# Patient Record
Sex: Male | Born: 1945 | Race: White | Hispanic: No | Marital: Married | Smoking: Current every day smoker
Health system: Southern US, Community
[De-identification: ages and names within clinical notes are randomized; demographics above are authoritative.]

## PROBLEM LIST (undated history)

## (undated) DIAGNOSIS — I219 Acute myocardial infarction, unspecified: Secondary | ICD-10-CM

## (undated) DIAGNOSIS — I509 Heart failure, unspecified: Secondary | ICD-10-CM

## (undated) DIAGNOSIS — I639 Cerebral infarction, unspecified: Secondary | ICD-10-CM

## (undated) HISTORY — PX: ABLATION: SHX5711

## (undated) HISTORY — PX: TOTAL SHOULDER ARTHROPLASTY: SHX126

## (undated) HISTORY — PX: GALLBLADDER SURGERY: SHX652

## (undated) HISTORY — PX: SKIN CANCER EXCISION: SHX779

---

## 2006-12-06 ENCOUNTER — Ambulatory Visit (HOSPITAL_COMMUNITY): Admission: RE | Admit: 2006-12-06 | Discharge: 2006-12-06 | Payer: Self-pay | Admitting: Urology

## 2013-09-22 ENCOUNTER — Emergency Department (HOSPITAL_COMMUNITY)
Admission: EM | Admit: 2013-09-22 | Discharge: 2013-09-22 | Disposition: A | Discharge status: Home / Self Care | Payer: Medicare Other | Attending: Emergency Medicine | Admitting: Emergency Medicine

## 2013-09-22 ENCOUNTER — Encounter (HOSPITAL_COMMUNITY): Payer: Self-pay | Admitting: Emergency Medicine

## 2013-09-22 ENCOUNTER — Emergency Department (HOSPITAL_COMMUNITY): Payer: Medicare Other

## 2013-09-22 DIAGNOSIS — R072 Precordial pain: Secondary | ICD-10-CM | POA: Insufficient documentation

## 2013-09-22 DIAGNOSIS — R42 Dizziness and giddiness: Secondary | ICD-10-CM | POA: Insufficient documentation

## 2013-09-22 DIAGNOSIS — Z8673 Personal history of transient ischemic attack (TIA), and cerebral infarction without residual deficits: Secondary | ICD-10-CM | POA: Insufficient documentation

## 2013-09-22 DIAGNOSIS — I509 Heart failure, unspecified: Secondary | ICD-10-CM | POA: Insufficient documentation

## 2013-09-22 DIAGNOSIS — R5381 Other malaise: Secondary | ICD-10-CM | POA: Insufficient documentation

## 2013-09-22 DIAGNOSIS — R079 Chest pain, unspecified: Secondary | ICD-10-CM

## 2013-09-22 DIAGNOSIS — I252 Old myocardial infarction: Secondary | ICD-10-CM | POA: Insufficient documentation

## 2013-09-22 DIAGNOSIS — R51 Headache: Secondary | ICD-10-CM | POA: Insufficient documentation

## 2013-09-22 DIAGNOSIS — R110 Nausea: Secondary | ICD-10-CM | POA: Insufficient documentation

## 2013-09-22 HISTORY — DX: Cerebral infarction, unspecified: I63.9

## 2013-09-22 HISTORY — DX: Heart failure, unspecified: I50.9

## 2013-09-22 HISTORY — DX: Acute myocardial infarction, unspecified: I21.9

## 2013-09-22 LAB — PRO B NATRIURETIC PEPTIDE: Pro B Natriuretic peptide (BNP): 124.9 pg/mL (ref 0–125)

## 2013-09-22 LAB — CBC WITH DIFFERENTIAL/PLATELET
Basophils Absolute: 0 10*3/uL (ref 0.0–0.1)
Basophils Relative: 1 % (ref 0–1)
Eosinophils Relative: 3 % (ref 0–5)
Hemoglobin: 15.5 g/dL (ref 13.0–17.0)
Lymphocytes Relative: 24 % (ref 12–46)
MCHC: 35.4 g/dL (ref 30.0–36.0)
MCV: 93.4 fL (ref 78.0–100.0)
Monocytes Absolute: 0.8 10*3/uL (ref 0.1–1.0)
Monocytes Relative: 10 % (ref 3–12)
Neutro Abs: 5.5 10*3/uL (ref 1.7–7.7)
Platelets: 294 10*3/uL (ref 150–400)
RBC: 4.69 MIL/uL (ref 4.22–5.81)

## 2013-09-22 LAB — TROPONIN I: Troponin I: <0.3 ng/mL (ref <0.30)

## 2013-09-22 LAB — COMPREHENSIVE METABOLIC PANEL
CO2: 27 mEq/L (ref 19–32)
Calcium: 8.9 mg/dL (ref 8.4–10.5)
Chloride: 101 mEq/L (ref 96–112)
GFR calc Af Amer: >90 mL/min (ref >90)
GFR calc non Af Amer: >90 mL/min (ref >90)
Glucose, Bld: 109 mg/dL — ABNORMAL HIGH (ref 70–99)
Total Bilirubin: 0.3 mg/dL (ref 0.3–1.2)

## 2013-09-22 MED ORDER — ONDANSETRON HCL 4 MG/2ML IJ SOLN
4.0000 mg | Freq: Once | INTRAMUSCULAR | Status: AC
  Administered 2013-09-22: 4 mg via INTRAVENOUS
  Filled 2013-09-22: qty 2

## 2013-09-22 MED ORDER — METHYLPREDNISOLONE SODIUM SUCC 125 MG IJ SOLR
125.0000 mg | INTRAMUSCULAR | Status: AC
  Administered 2013-09-22: 125 mg via INTRAVENOUS
  Filled 2013-09-22: qty 2

## 2013-09-22 MED ORDER — ONDANSETRON 4 MG PO TBDP
8.0000 mg | ORAL_TABLET | Freq: Once | ORAL | Status: AC
  Administered 2013-09-22: 8 mg via ORAL
  Filled 2013-09-22: qty 2

## 2013-09-22 MED ORDER — ONDANSETRON HCL 4 MG/2ML IJ SOLN
8.0000 mg | Freq: Once | INTRAMUSCULAR | Status: AC
  Administered 2013-09-22: 8 mg via INTRAVENOUS
  Filled 2013-09-22: qty 4

## 2013-09-22 MED ORDER — PREDNISONE 10 MG PO TABS: 40.0000 mg | ORAL_TABLET | Freq: Every day | ORAL | Status: AC

## 2013-09-22 MED ORDER — HYDROMORPHONE HCL PF 1 MG/ML IJ SOLN
1.0000 mg | Freq: Once | INTRAMUSCULAR | Status: AC
  Administered 2013-09-22: 1 mg via INTRAVENOUS
  Filled 2013-09-22: qty 1

## 2013-09-22 MED ORDER — ALBUTEROL SULFATE HFA 108 (90 BASE) MCG/ACT IN AERS: 2.0000 | INHALATION_SPRAY | RESPIRATORY_TRACT | Status: AC

## 2013-09-22 NOTE — ED Notes (Signed)
Dr.Lockwood at bedside  

## 2013-09-22 NOTE — ED Provider Notes (Signed)
CSN: [id number]     Arrival date & time 09/22/13  1224 History   First MD Initiated Contact with Patient 09/22/13 1222     Chief Complaint  Patient presents with  . Headache  . Chest Pain   (Consider location/radiation/quality/duration/timing/severity/associated sxs/prior Treatment) HPI  Patient presents with concern of headache, chest pain. Symptoms began 3 days ago, initially with chest pain.  Over the interval his developed headache, and continues to have other associated complaints. The chest pain is anterior, sternal, severe, pressure like, nonradiating.  There is no exertional or pleuritic component. There continues to be baseline dyspnea. The headache is left frontal, severe, nonradiating.  Pain is worse with activity.  Patient describes a twitching sensation in his left eye, but no explicit visual changes. Testicle is of nausea, but denies vomiting or diarrhea.  He denies fever, cough. Patient notes a history of prior strokes, prior MI. He has baseline left-sided weakness.  Past Medical History  Diagnosis Date  . Stroke   . CHF (congestive heart failure)   . MI (myocardial infarction)    History reviewed. No pertinent past surgical history. No family history on file. History  Substance Use Topics  . Smoking status: Not on file  . Smokeless tobacco: Not on file  . Alcohol Use: Not on file    Review of Systems  Constitutional:       Per HPI, otherwise negative  HENT:       Per HPI, otherwise negative  Respiratory:       Per HPI, otherwise negative  Cardiovascular:       Per HPI, otherwise negative  Gastrointestinal: Positive for nausea. Negative for vomiting and diarrhea.  Endocrine:       Negative aside from HPI  Genitourinary:       Neg aside from HPI   Musculoskeletal:       Per HPI, otherwise negative  Skin: Negative.   Neurological: Positive for dizziness, weakness, light-headedness and headaches. Negative for syncope, speech difficulty and numbness.     Allergies  Asa; Codeine; Oxycodone; and Sulfur  Home Medications  No current outpatient prescriptions on file. There were no vitals taken for this visit. Physical Exam  Nursing note and vitals reviewed. Constitutional: He is oriented to person, place, and time. He appears well-developed. No distress.  HENT:  Head: Normocephalic and atraumatic.  Eyes: Conjunctivae and EOM are normal. Pupils are equal, round, and reactive to light.  Neck: Full passive range of motion without pain. No muscular tenderness present. No rigidity. No edema present.  Cardiovascular: Normal rate and regular rhythm.   Pulmonary/Chest: Effort normal. No stridor. No respiratory distress.  Abdominal: He exhibits no distension.  Musculoskeletal: He exhibits no edema.  Neurological: He is alert and oriented to person, place, and time.  Weakness slightly greater on the left upper extremity than right.  Patient states that he typically has this deficiency. LE strength 5/5 sym, No facial asym AOx 3  Skin: Skin is warm and dry.  Psychiatric: He has a normal mood and affect.    ED Course  Procedures (including critical care time) Labs Review Labs Reviewed  CBC WITH DIFFERENTIAL  COMPREHENSIVE METABOLIC PANEL  TROPONIN I   Imaging Review No results found. EKG has sinus rhythm, rate 60, left bundle-branch block, T wave inversions laterally abnormal On cardiac monitor the patient is sinus rhythm, rate 65, unremarkable Pulse oximetry 97% on room and this is adequate  6:34 PM Patient voices some frustration about lack of nursing  tension.  He has no additional chest pain.  He does continue to have mild headache. Again we reviewed all his labs, imaging studies. I reviewed follow precautions, return instructions with him. On repeat examination, the patient's heart rate is 85, sinus, unremarkable   MDM  No diagnosis found. This patient presents with days of chest pressure, cough, dyspnea, headache.  On exam  he is awake alert, with no neurologic deficits.  Patient takes Plavix regularly.  Patient's EKG is unchanged from those are compared with his other hospital. Patient's troponin is unremarkable.  Though the patient does have history of heart disease, stroke, he is appropriately anticoagulated, and today's evaluation did not demonstrate acute findings.  Patient was substantially improved with analgesia here.  I had a lengthy discussion with the patient and his family members about the possible causes of his chest discomfort, dyspnea, headache.  With his history of COPD, he was treated empirically for the syncope.  There is no evidence of infection.  Absent neurologic findings, with essentially unremarkable head CT, there is low suspicion for occult CVA. Patient has a cardiologist with whom he will follow up in the morning.    Gerhard Munch, MD 09/22/13 [phone]

## 2013-09-22 NOTE — ED Notes (Signed)
Nathanial Rancher (daughter)  [phone] power of attorney/ Willoughby Doell [phone] (son)

## 2013-09-22 NOTE — ED Notes (Signed)
Pt c/o left sided HA and central CP that started 3-4 days ago. Pt has hx of stroke and MI. Vitals 183/84, 60 pulse pt rates pain in chest 3/10 and pain in head 9/10. On 12 lead pt showed inverted T waves. Pt was not given any meds en route. Pt allergic to ASA.

## 2013-09-22 NOTE — ED Notes (Signed)
Patient actively vomiting after discharge completed. MD informed. Zofran given. Family and patient did not wish to wait any longer prior to leaving. Patient left department with family via wheelchair.

## 2013-11-12 DIAGNOSIS — M546 Pain in thoracic spine: Secondary | ICD-10-CM | POA: Insufficient documentation

## 2014-08-31 DIAGNOSIS — I10 Essential (primary) hypertension: Secondary | ICD-10-CM | POA: Insufficient documentation

## 2014-08-31 DIAGNOSIS — I2510 Atherosclerotic heart disease of native coronary artery without angina pectoris: Secondary | ICD-10-CM | POA: Insufficient documentation

## 2014-08-31 DIAGNOSIS — E785 Hyperlipidemia, unspecified: Secondary | ICD-10-CM | POA: Insufficient documentation

## 2014-10-04 DIAGNOSIS — I447 Left bundle-branch block, unspecified: Secondary | ICD-10-CM | POA: Insufficient documentation

## 2015-04-14 DIAGNOSIS — I428 Other cardiomyopathies: Secondary | ICD-10-CM | POA: Insufficient documentation

## 2015-04-29 IMAGING — CT CT HEAD W/O CM
2 series · 15 of 30 positions shown, 19 images · non-contrast
Comparison: None.

CLINICAL DATA: Three-day history of headache with nausea

EXAM:
CT HEAD WITHOUT CONTRAST
TECHNIQUE: Contiguous axial images were obtained from the base of the skull
through the vertex without intravenous contrast. Study was obtained
within 24 hr of patient's arrival at the emergency department.

[Series 2: head w/o · axial · non-contrast · 0.49mm/px · z∈[+120,+245]mm · 13 of 31 slices shown, 17 images]
[im 3/31  brain]
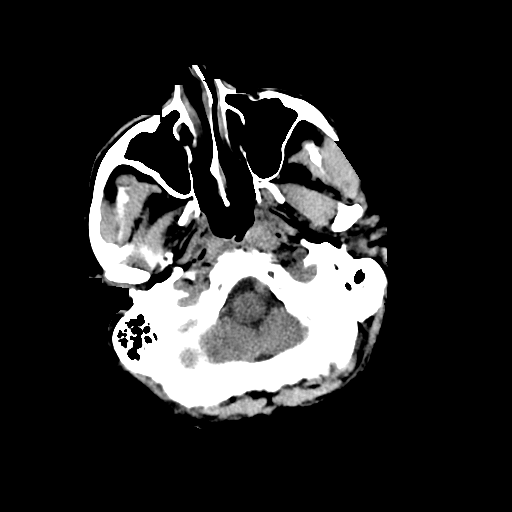
[im 3/31  bone]
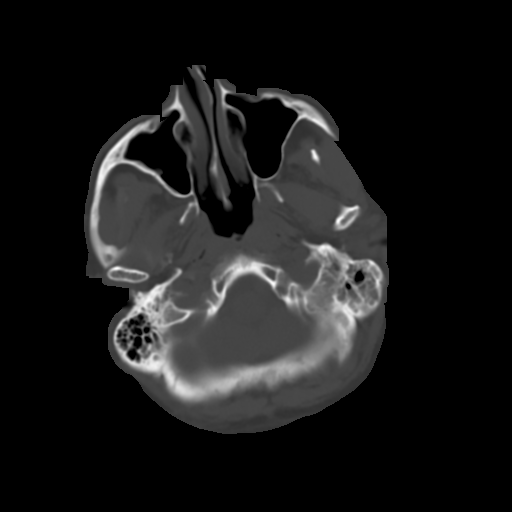
[im 5/31  brain]
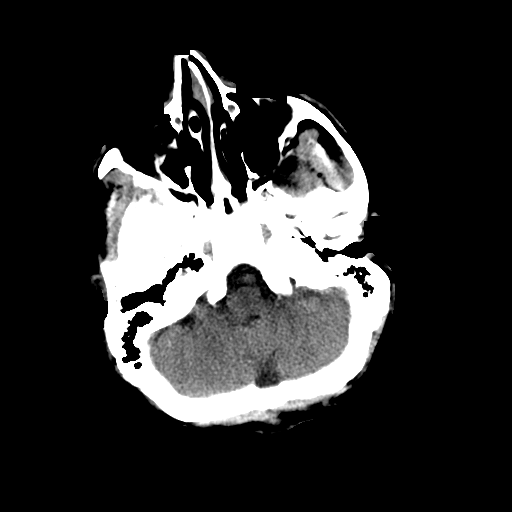
[im 7/31  brain]
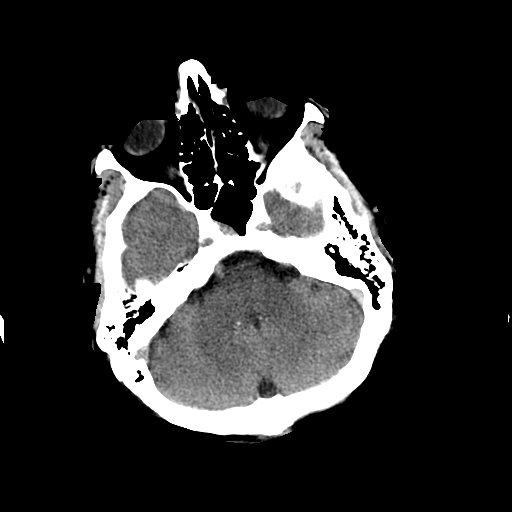
[im 9/31  brain]
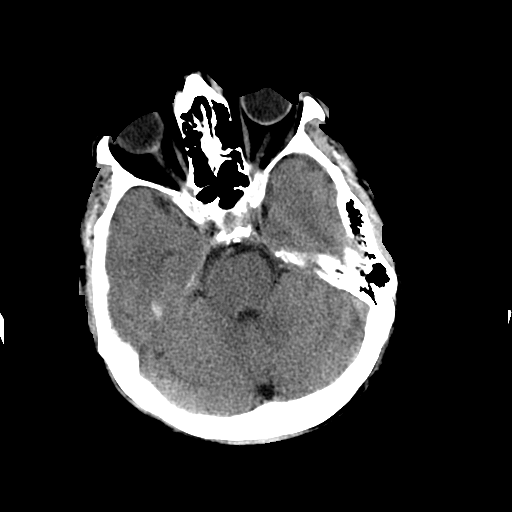
[im 11/31  brain]
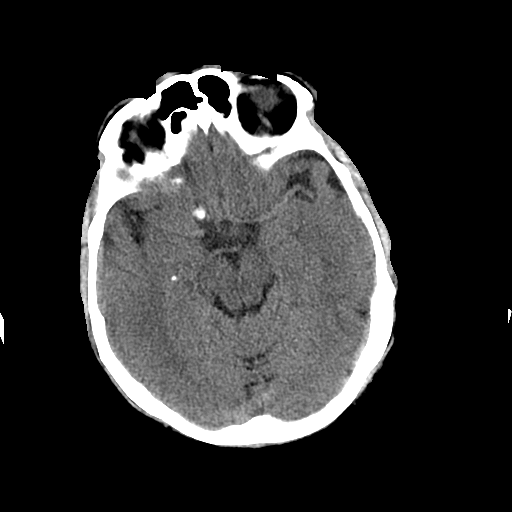
[im 11/31  bone]
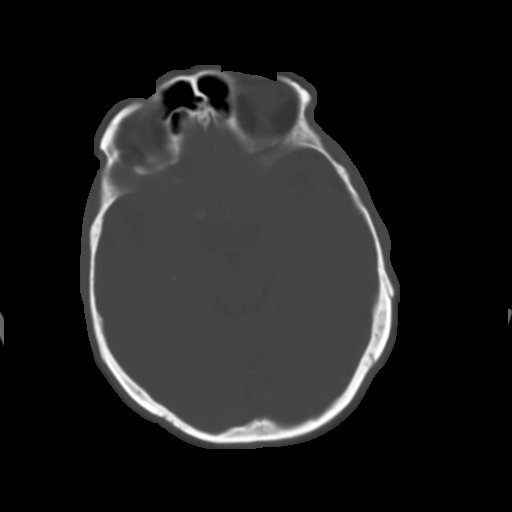
[im 13/31  brain]
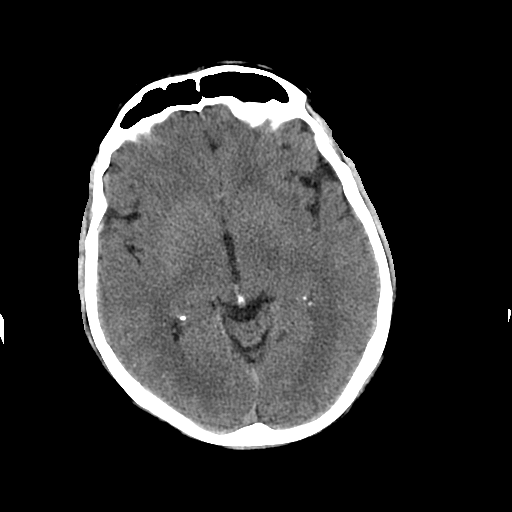
[im 16/31  brain]
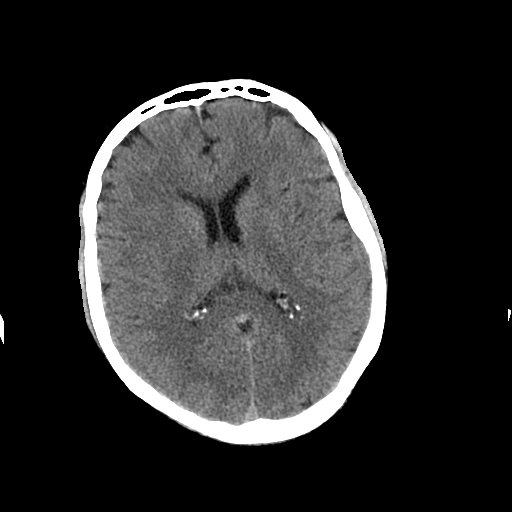
[im 18/31  brain]
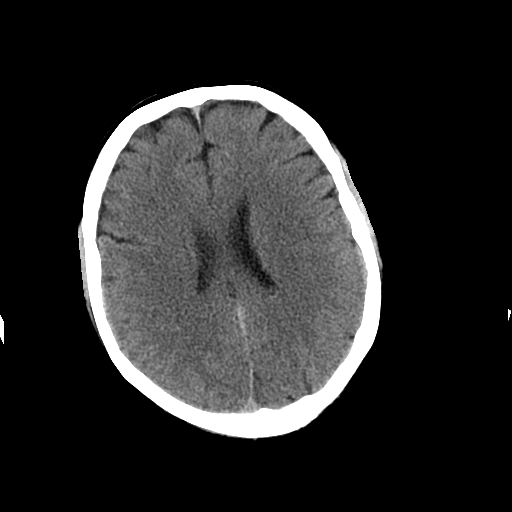
[im 20/31  brain]
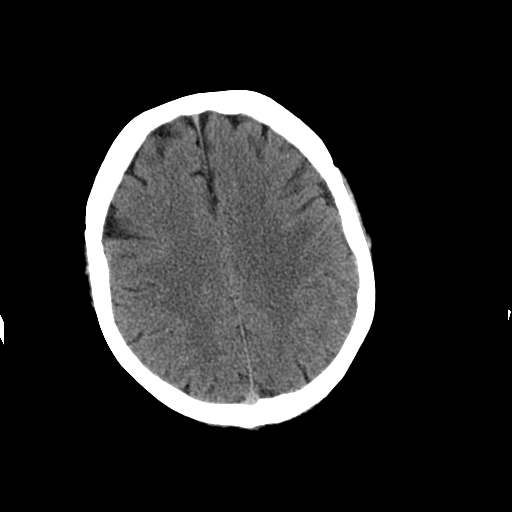
[im 20/31  bone]
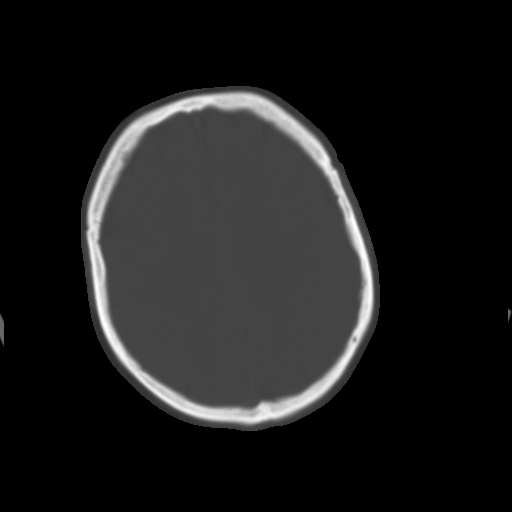
[im 22/31  brain]
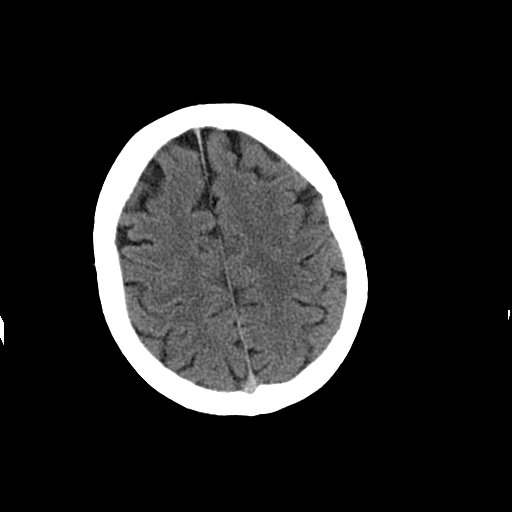
[im 24/31  brain]
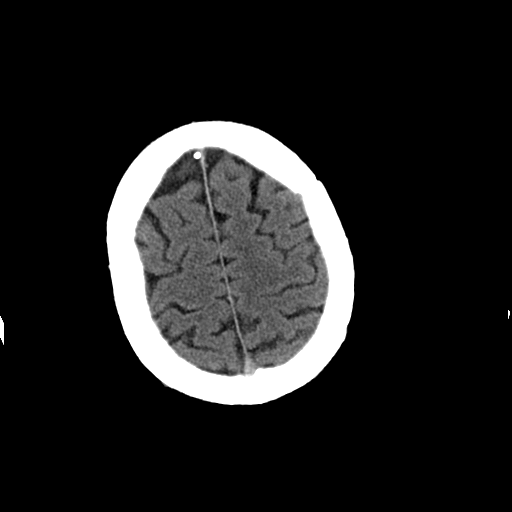
[im 26/31  brain]
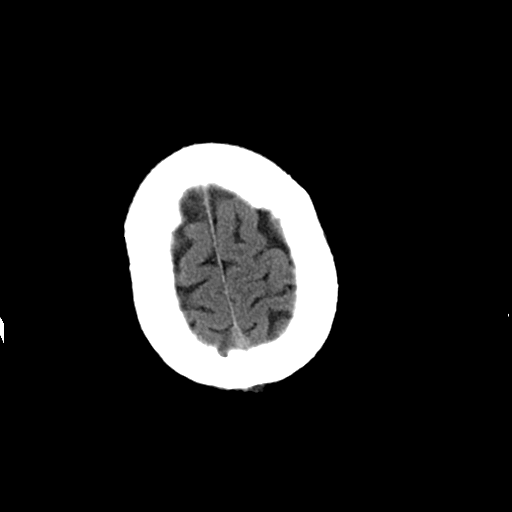
[im 28/31  brain]
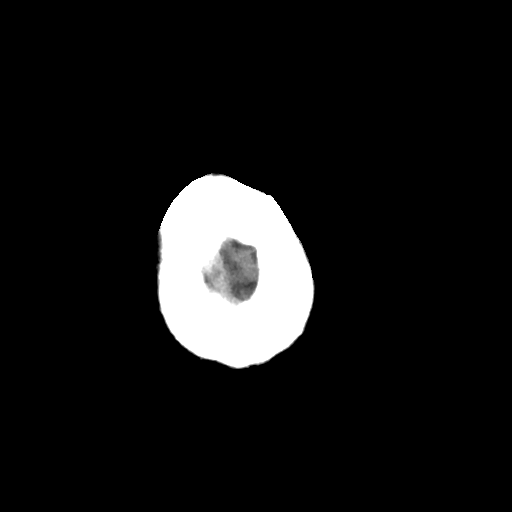
[im 28/31  bone]
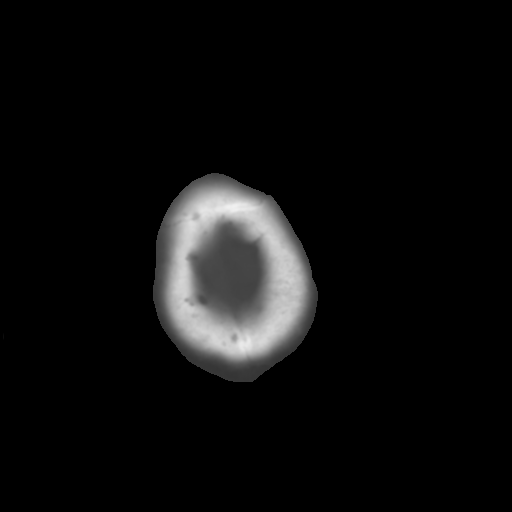

[Series 3: head w/o bone · axial · non-contrast · 0.49mm/px · z∈[+120,+140]mm · 2 of 31 slices shown]
[im 3/31  bone]
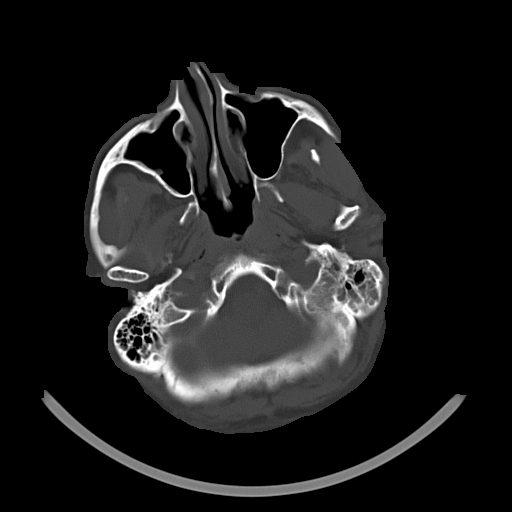
[im 7/31  bone]
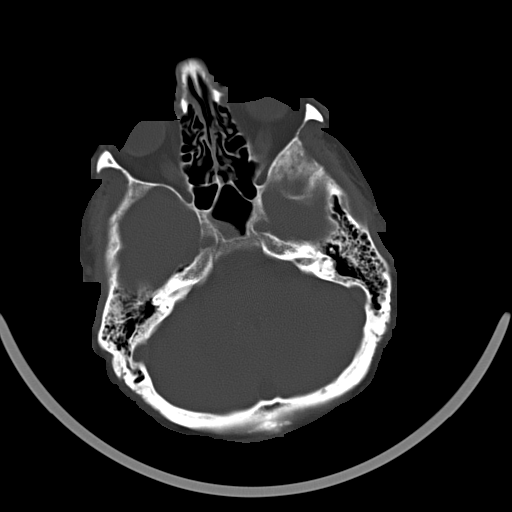

[15 of 30 positions shown; findings below may reference images not displayed]

FINDINGS: The ventricles are normal in size and configuration. There is no
mass, hemorrhage, extra-axial fluid collection, or midline shift.
There is mild small vessel disease in the centra semiovale
bilaterally. There is evidence of a small prior infarct at the
gray-white junction of the posterior left frontal lobe. No acute
appearing infarct is seen on this study.

Bony calvarium appears intact. There is mastoid air cell disease
bilaterally, considerably more on the left than on the right. No
air-fluid levels are identified in the mastoids. There is
right-sided sphenoid sinus disease as well as a retention cyst in
the posterior right maxillary antrum. There is leftward deviation of
the nasal septum.
IMPRESSION: The mild periventricular small vessel disease. Evidence of a prior
small infarct in the posterior left frontal lobe at the gray-white
junction. No acute appearing infarct. No mass or hemorrhage.

There is bilateral mastoid disease, more on the left than on the
right. There is mild paranasal sinus disease. There is leftward
deviation of the nasal septum.

## 2015-04-29 IMAGING — CR DG CHEST 2V
2 series · 2 of 2 positions shown · non-contrast
Comparison: 12/06/2006

CLINICAL DATA: Syncope. Weakness. Hypertension.

EXAM:
CHEST  2 VIEW

[w chest lat]
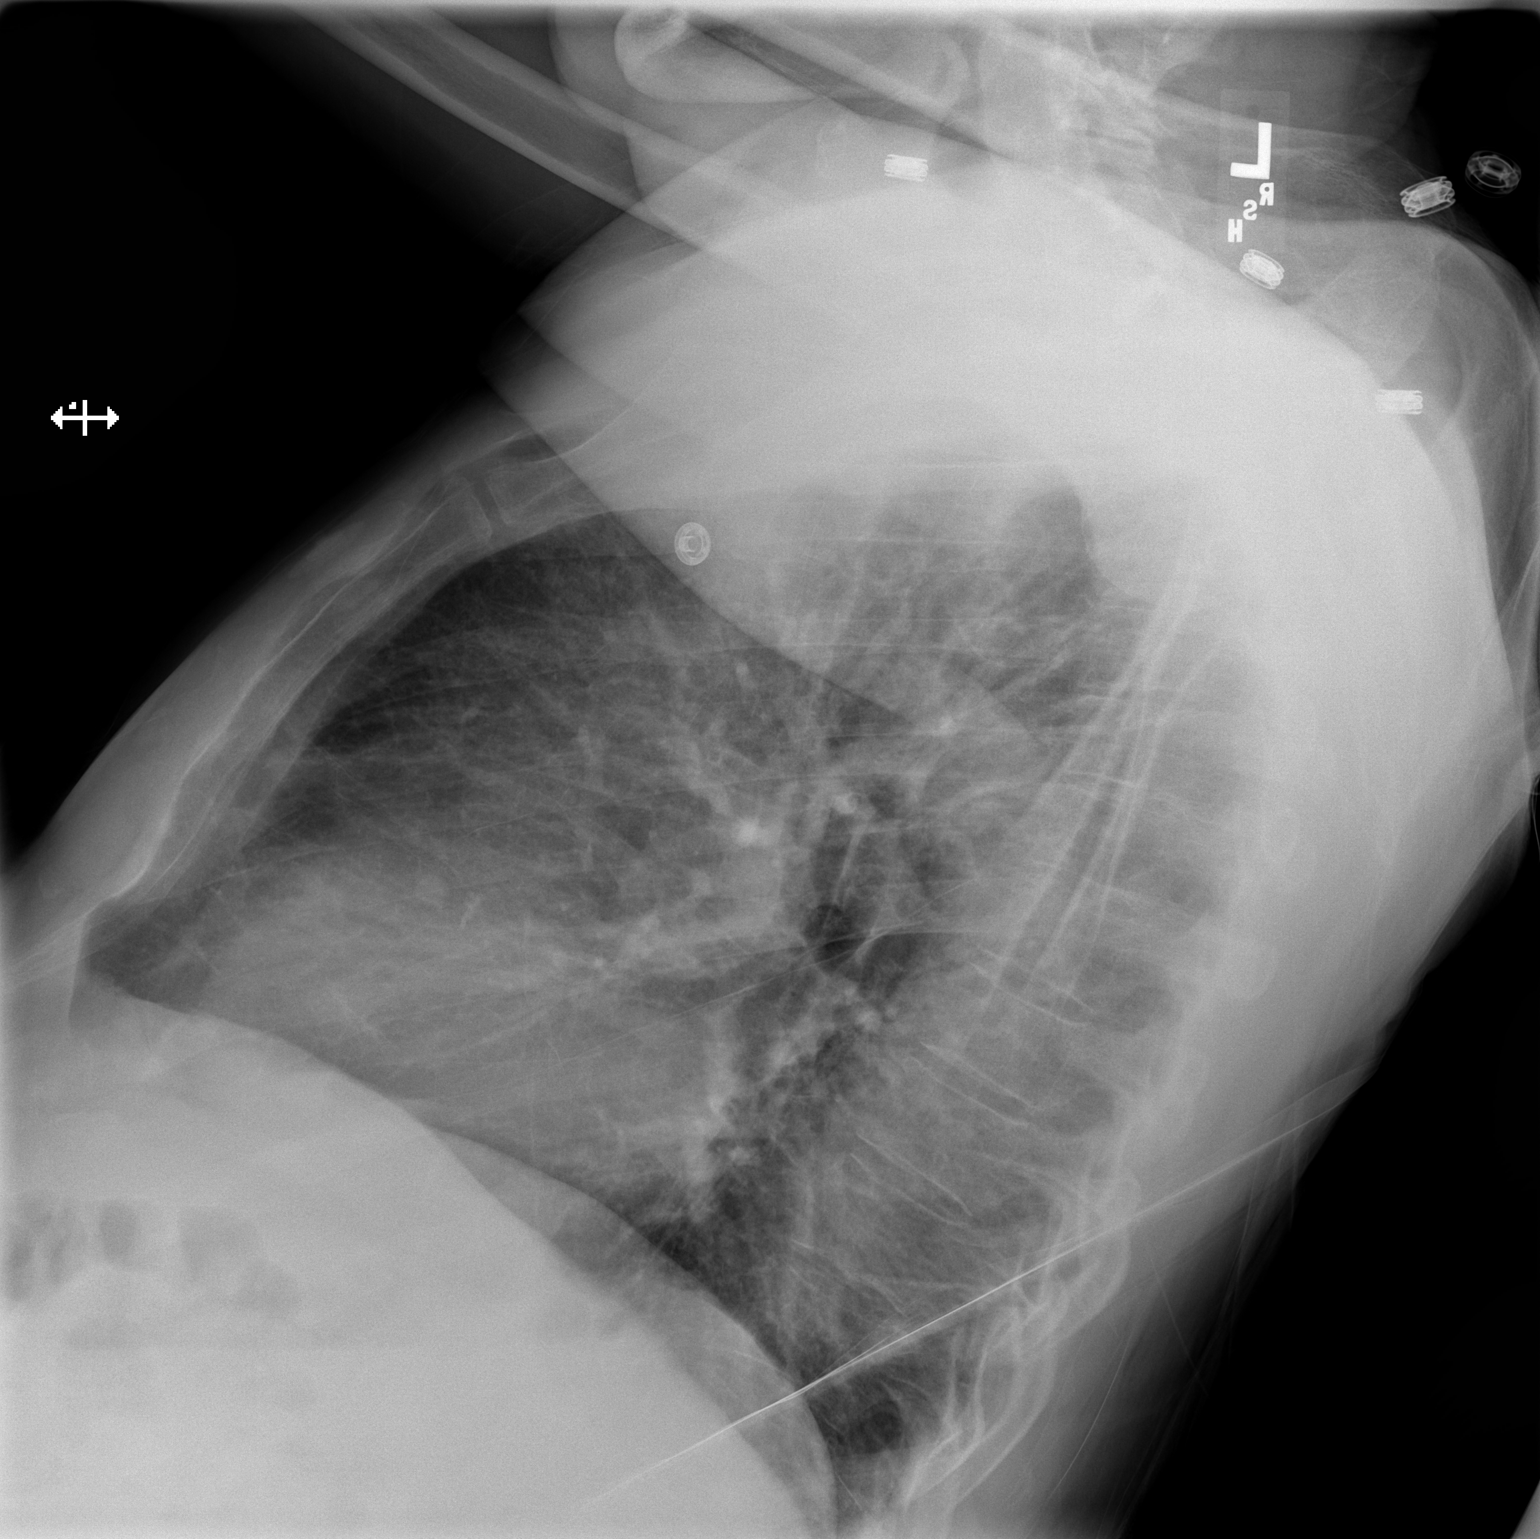

[x chest ap]
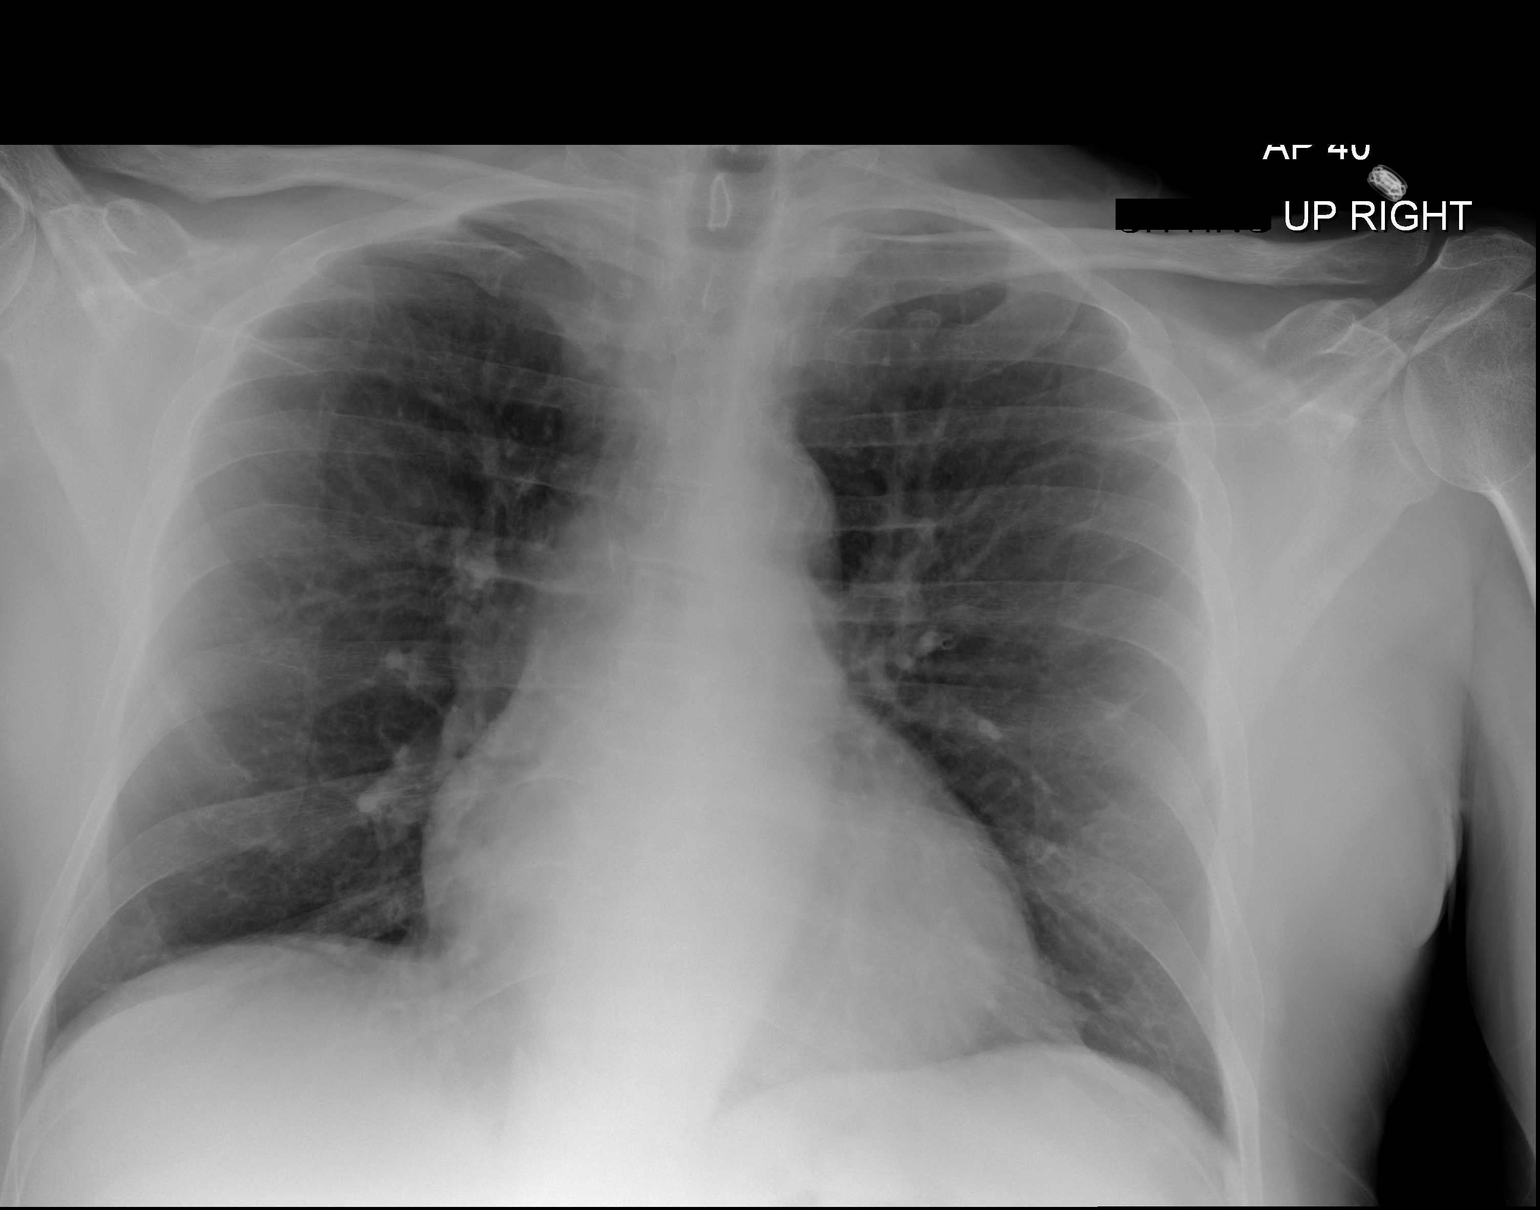

[2 of 2 positions shown; findings below may reference images not displayed]

FINDINGS: Tortuous thoracic aorta noted with atherosclerotic calcification of
the aortic arch. Heart size within normal limits for projection.
Subtle prominence of upper zone pulmonary vasculature.

Mild airway thickening. 9 mm nodule projects over the cardiac shadow
on the lateral projection and is not well correlated on the frontal
projection.
IMPRESSION: 1. Possible pulmonary nodule projecting over the cardiac shadow on
the lateral view. CT chest is recommended for further
characterization, to exclude malignancy.
2. Prominence of upper zone pulmonary vasculature may reflect
pulmonary venous hypertension, but there is no overt edema.
3. Aortic atherosclerosis.
4. Mild airway thickening, query bronchitis or reactive airways
disease.

## 2015-06-06 DIAGNOSIS — I480 Paroxysmal atrial fibrillation: Secondary | ICD-10-CM | POA: Insufficient documentation

## 2016-02-20 DIAGNOSIS — R911 Solitary pulmonary nodule: Secondary | ICD-10-CM | POA: Insufficient documentation

## 2016-02-20 DIAGNOSIS — J449 Chronic obstructive pulmonary disease, unspecified: Secondary | ICD-10-CM | POA: Insufficient documentation

## 2017-04-03 DIAGNOSIS — I5022 Chronic systolic (congestive) heart failure: Secondary | ICD-10-CM | POA: Insufficient documentation

## 2017-04-03 DIAGNOSIS — Z8673 Personal history of transient ischemic attack (TIA), and cerebral infarction without residual deficits: Secondary | ICD-10-CM | POA: Insufficient documentation

## 2017-04-03 DIAGNOSIS — E876 Hypokalemia: Secondary | ICD-10-CM | POA: Insufficient documentation

## 2017-04-03 DIAGNOSIS — K279 Peptic ulcer, site unspecified, unspecified as acute or chronic, without hemorrhage or perforation: Secondary | ICD-10-CM | POA: Insufficient documentation

## 2017-04-04 DIAGNOSIS — K219 Gastro-esophageal reflux disease without esophagitis: Secondary | ICD-10-CM | POA: Insufficient documentation

## 2017-04-04 DIAGNOSIS — D649 Anemia, unspecified: Secondary | ICD-10-CM | POA: Insufficient documentation

## 2017-04-04 DIAGNOSIS — K769 Liver disease, unspecified: Secondary | ICD-10-CM | POA: Insufficient documentation

## 2018-06-30 DIAGNOSIS — I2510 Atherosclerotic heart disease of native coronary artery without angina pectoris: Secondary | ICD-10-CM | POA: Insufficient documentation

## 2019-07-01 DIAGNOSIS — F17200 Nicotine dependence, unspecified, uncomplicated: Secondary | ICD-10-CM | POA: Insufficient documentation

## 2020-02-26 DIAGNOSIS — F5104 Psychophysiologic insomnia: Secondary | ICD-10-CM | POA: Insufficient documentation

## 2020-02-26 DIAGNOSIS — F419 Anxiety disorder, unspecified: Secondary | ICD-10-CM | POA: Insufficient documentation

## 2020-07-01 DIAGNOSIS — I5020 Unspecified systolic (congestive) heart failure: Secondary | ICD-10-CM | POA: Insufficient documentation

## 2020-10-17 DIAGNOSIS — Z862 Personal history of diseases of the blood and blood-forming organs and certain disorders involving the immune mechanism: Secondary | ICD-10-CM | POA: Insufficient documentation

## 2020-11-14 DIAGNOSIS — N281 Cyst of kidney, acquired: Secondary | ICD-10-CM | POA: Insufficient documentation

## 2021-01-20 DIAGNOSIS — D75839 Thrombocytosis, unspecified: Secondary | ICD-10-CM | POA: Insufficient documentation

## 2021-10-05 DIAGNOSIS — Z955 Presence of coronary angioplasty implant and graft: Secondary | ICD-10-CM | POA: Insufficient documentation

## 2021-12-14 DIAGNOSIS — D509 Iron deficiency anemia, unspecified: Secondary | ICD-10-CM | POA: Insufficient documentation

## 2022-02-07 DIAGNOSIS — E538 Deficiency of other specified B group vitamins: Secondary | ICD-10-CM | POA: Insufficient documentation

## 2022-12-17 DIAGNOSIS — Z95818 Presence of other cardiac implants and grafts: Secondary | ICD-10-CM | POA: Insufficient documentation

## 2022-12-29 NOTE — Progress Notes (Signed)
Office Visit Note  Patient: John Dennis             Date of Birth: 05-14-1946           MRN: [id number]             PCP: Roe Rutherford, NP Referring: Roe Rutherford, NP Visit Date: 01/01/2023  Subjective:  New Patient (Initial Visit) (Rheumatoid arthritis )   History of Present Illness: John Dennis is a 76 y.o. male here for evaluation of joint and muscle pain in multiple areas with positive rheumatoid factor and elevated sedimentation rate.  The pain has really been worse since last year sometime during the summertime.  He experiences pain in both hands especially around the MCP and PIP joints occasionally feeling of locking up and difficulty fully closing his hands or gripping tightly.  He frequently notices worsening of symptoms after driving or when using hand tools for prolonged periods of time.  He does see some visible swelling coming and going his feet and gets some pain more around the toes. But feet are less problematic than his hands most recently. He was ill with COVID in September and feels symptoms have been more severe since that time as well.  He gets a partial benefit with Tylenol and tries topical lidocaine which is helpful when his hands are especially painful.  His history is also significant for COPD and some worsening dyspnea on exertion after COVID illness.  He is on supplemental oxygen by nasal cannula for his COPD but this was at baseline even prior to the recent COVID illness.  Also with CAD s/p stenting and Afib on plavix.  Labs reviewed ANA neg RF 37.2 ESR 31 CRP 16  Imaging reviewed Lung imaging with multiple pulmonary nodules up to 12 mm diameter and most recent PET imaging with negative for FDG uptake.  Activities of Daily Living:  Patient reports morning stiffness for 20 minutes.   Patient Denies nocturnal pain.  Difficulty dressing/grooming: Denies Difficulty climbing stairs: Reports Difficulty getting out of chair: Denies Difficulty  using hands for taps, buttons, cutlery, and/or writing: Reports  Review of Systems  Constitutional:  Positive for fatigue.  HENT:  Negative for mouth sores and mouth dryness.   Eyes:  Negative for dryness.  Respiratory:  Positive for shortness of breath.   Cardiovascular:  Negative for chest pain and palpitations.  Gastrointestinal:  Negative for blood in stool, constipation and diarrhea.  Endocrine: Positive for increased urination.  Genitourinary:  Negative for involuntary urination.  Musculoskeletal:  Positive for joint pain, joint pain, joint swelling and muscle weakness. Negative for gait problem, myalgias, morning stiffness, muscle tenderness and myalgias.  Skin:  Positive for color change. Negative for hair loss and sensitivity to sunlight.  Allergic/Immunologic: Negative for susceptible to infections.  Neurological:  Negative for dizziness and headaches.  Hematological:  Negative for swollen glands.  Psychiatric/Behavioral:  Positive for depressed mood and sleep disturbance. The patient is nervous/anxious.     PMFS History:  Patient Active Problem List   Diagnosis Date Noted   Presence of Watchman left atrial appendage closure device 12/17/2022   B12 deficiency 02/07/2022   Iron deficiency anemia 12/14/2021   S/P coronary artery stent placement 10/05/2021   Thrombocytosis 01/20/2021   Renal cyst 11/14/2020   H/O polycythemia 10/17/2020   HFrEF (heart failure with reduced ejection fraction) (HCC) 07/01/2020   Anxiety 02/26/2020   Psychophysiological insomnia 02/26/2020   Nicotine dependence, uncomplicated 07/01/2019   Nonobstructive atherosclerosis of  coronary artery 06/30/2018   Gastroesophageal reflux disease without esophagitis 04/04/2017   Lesion of liver 04/04/2017   Normocytic anemia 04/04/2017   Chronic systolic CHF (congestive heart failure) (HCC) 04/03/2017   History of stroke 04/03/2017   Hypokalemia 04/03/2017   Peptic ulcer disease 04/03/2017   Chronic  obstructive pulmonary disease (HCC) 02/20/2016   Lung nodule 02/20/2016   Paroxysmal atrial fibrillation (HCC) 06/06/2015   Non-ischemic cardiomyopathy (HCC) 04/14/2015   LBBB (left bundle branch block) 10/04/2014   Coronary artery disease involving native coronary artery of native heart without angina pectoris 08/31/2014   Essential hypertension 08/31/2014   Hyperlipidemia 08/31/2014   Back pain of thoracolumbar region 11/12/2013    Past Medical History:  Diagnosis Date   CHF (congestive heart failure) (HCC)    MI (myocardial infarction) (HCC)    Stroke (HCC)     History reviewed. No pertinent family history. Past Surgical History:  Procedure Laterality Date   ABLATION     [place]   GALLBLADDER SURGERY     SKIN CANCER EXCISION     TOTAL SHOULDER ARTHROPLASTY     Social History   Social History Narrative   Not on file   Immunization History  Administered Date(s) Administered   Fluad Quad(high Dose 65+) 10/17/2020   Influenza Inj Mdck Quad Pf 10/02/2017   Influenza Split 09/11/2019   PFIZER(Purple Top)SARS-COV-2 Vaccination 01/30/2020, 02/20/2020, 10/11/2020, 04/21/2021   Pneumococcal Conjugate-13 11/25/2018   Pneumococcal Polysaccharide-23 12/29/2019   Respiratory Syncytial Virus Vaccine,Recomb Aduvanted(Arexvy) 10/10/2022   Zoster Recombinat (Shingrix) 05/16/2020     Objective: Vital Signs: BP 137/79 (BP Location: Right Arm, Patient Position: Sitting, Cuff Size: Normal)   Pulse 60   Resp 18   Ht 5\' 9"  (1.753 m)   Wt 182 lb (82.6 kg)   BMI 26.88 kg/m    Physical Exam Eyes:     Conjunctiva/sclera: Conjunctivae normal.  Cardiovascular:     Rate and Rhythm: Normal rate and regular rhythm.  Pulmonary:     Comments: On supplemental oxygen by nasal cannula, slightly diminished breath sounds b/l Musculoskeletal:     Right lower leg: No edema.     Left lower leg: No edema.  Skin:    Findings: No rash.  Neurological:     Mental Status: He is alert.  Psychiatric:         Mood and Affect: Mood normal.      Musculoskeletal Exam:  Shoulders full ROM no tenderness or swelling Elbows full ROM no tenderness or swelling Wrists full ROM no tenderness or swelling Fingers full ROM right 4th MCP tenderness to pressure, left wrist tenderness to pressure on flexor side Knees full ROM no tenderness or swelling Ankles full ROM no tenderness or swelling MTP squeeze tenderness and to pressure at left 2nd-4th MTPs    Investigation: No additional findings.  Imaging: XR Foot 2 Views Right  Result Date: 01/01/2023 X-ray right foot 2 views Tibiotalar joint space alignment appears normal.  Midfoot joint spaces appear normal.  1st metatarsal head probable postsurgical changes along medial border. MTP and IP joint spaces appear normal.  There is probable periarticular osteopenia of the MTP joints.  No erosions seen. Impression Para-articular osteopenia could be consistent with chronic inflammatory arthritis of the MTPs but no erosive disease  XR Foot 2 Views Left  Result Date: 01/01/2023 X-ray left foot 2 views Tibiotalar joint space alignment appears normal.  There is a small plantar calcaneal enthesophyte.  Midfoot joint spaces appear normal.  MTP and IP  joint spaces appear normal.  There is probable periarticular osteopenia of the MTP joints.  No erosions seen. Impression Para-articular osteopenia and cock-up toe positioning could be consistent with chronic inflammatory arthritis of the MTPs but no erosive disease changes no significant osteoarthritis  XR Hand 2 View Right  Result Date: 01/01/2023 X-ray right hand 2 views Radiocarpal and carpal joint spaces appear normal.  MCP joint spaces appear normal with cystic changes and third MCP head.  Some periosteal reaction or possible early enthesophytes at the proximal phalanges especially third digit but involving others.  There is mild asymmetric joint space narrowing at DIP joints.  No erosions seen.  Bone mineralization  appears normal. Impression Changes may be consistent with early osteoarthritis although some evidence of chronic enthesopathy changes which could be chronic inflammatory or use related  XR Hand 2 View Left  Result Date: 01/01/2023 X-ray left hand 2 views Radiocarpal carpal joint spaces appear normal.  First MCP and IP joint osteophyte processes.  MCP joint spaces appear normal with cystic changes and third MCP head.  Some periosteal reaction or possible early enthesophytes at the proximal phalanges especially third digit.  There are some early lateral osteophyte and some asymmetric joint space narrowing at DIP joints.  No erosions seen.  Bone mineralization appears normal. Impression Changes may be consistent with early osteoarthritis although some evidence of chronic enthesopathy changes which could be chronic inflammatory or use related   Recent Labs: Lab Results  Component Value Date   WBC 9.8 01/01/2023   HGB 14.4 01/01/2023   PLT 437 (H) 01/01/2023   NA 141 01/01/2023   K 4.2 01/01/2023   CL 100 01/01/2023   CO2 29 01/01/2023   GLUCOSE 78 01/01/2023   BUN 13 01/01/2023   CREATININE 0.89 01/01/2023   BILITOT 0.6 01/01/2023   ALKPHOS 87 09/22/2013   AST 17 01/01/2023   ALT 13 01/01/2023   PROT 7.4 01/01/2023   ALBUMIN 3.9 09/22/2013   CALCIUM 9.7 01/01/2023   GFRAA >90 09/22/2013   QFTBGOLDPLUS NEGATIVE 01/01/2023    Speciality Comments: No specialty comments available.  Procedures:  No procedures performed Allergies: Contrast media [iodinated contrast media], Elemental sulfur, Oxycodone, Asa [aspirin], and Codeine   Assessment / Plan:     Visit Diagnoses: Rheumatoid factor positive  Sedimentation rate elevation - Plan: Cyclic citrul peptide antibody, IgG, Sedimentation rate, C-reactive protein  Clinical picture looks suspicious for a new rheumatoid arthritis or at least some type of inflammatory arthritis could also be reactive with recent illness and other medical issues.   Checking CCP antibodies also rechecking sedimentation rate and CRP for inflammatory activity monitoring at this time.  No evidence of erosive disease but pretty significant joint symptoms.  May avoid methotrexate as a first-line treatment option to try however due to his pulmonary disease and multiple existing nodules.  Bilateral hand pain - Plan: XR Hand 2 View Right, XR Hand 2 View Left Bilateral foot pain - Plan: XR Foot 2 Views Right, XR Foot 2 Views Left  X-rays of bilateral hands and feet shows some osteoarthritis but there is also areas of enthesophyte processes and possible juxta articular osteopenia suggestive for an inflammatory arthritis process.  Paroxysmal atrial fibrillation (HCC)  Not indicated good candidate for nonsteroidal anti-inflammatory drugs due to paroxysmal atrial fibrillation also on long-term Plavix..  High risk medication use - Plan: CBC with Differential/Platelet, COMPLETE METABOLIC PANEL WITH GFR, Hepatitis B core antibody, IgM, Hepatitis C antibody, Hepatitis B surface antigen, QuantiFERON-TB Gold Plus  Clinical picture very suspicious for inflammatory process will check baseline laboratory workup for DMARD medication monitoring including CBC CMP hepatitis screening and QuantiFERON.  Orders: Orders Placed This Encounter  Procedures   XR Hand 2 View Right   XR Hand 2 View Left   XR Foot 2 Views Right   XR Foot 2 Views Left   Cyclic citrul peptide antibody, IgG   Sedimentation rate   C-reactive protein   CBC with Differential/Platelet   COMPLETE METABOLIC PANEL WITH GFR   Hepatitis B core antibody, IgM   Hepatitis C antibody   Hepatitis B surface antigen   QuantiFERON-TB Gold Plus   No orders of the defined types were placed in this encounter.    Follow-Up Instructions: Return in about 2 weeks (around 01/15/2023) for New pt ?RA f/u 2wks.   Fuller Plan, MD  Note - This record has been created using [employer].  Chart creation errors  have been sought, but may not always  have been located. Such creation errors do not reflect on  the standard of medical care.

## 2023-01-01 ENCOUNTER — Ambulatory Visit (INDEPENDENT_AMBULATORY_CARE_PROVIDER_SITE_OTHER): Payer: Medicare Other

## 2023-01-01 ENCOUNTER — Ambulatory Visit: Payer: Medicare Other | Attending: Internal Medicine | Admitting: Internal Medicine

## 2023-01-01 ENCOUNTER — Ambulatory Visit: Payer: Medicare Other

## 2023-01-01 ENCOUNTER — Encounter: Payer: Self-pay | Admitting: Internal Medicine

## 2023-01-01 VITALS — BP 137/79 | HR 60 | Resp 18 | Ht 69.0 in | Wt 182.0 lb

## 2023-01-01 DIAGNOSIS — Z79899 Other long term (current) drug therapy: Secondary | ICD-10-CM

## 2023-01-01 DIAGNOSIS — M79672 Pain in left foot: Secondary | ICD-10-CM

## 2023-01-01 DIAGNOSIS — R700 Elevated erythrocyte sedimentation rate: Secondary | ICD-10-CM | POA: Diagnosis not present

## 2023-01-01 DIAGNOSIS — R768 Other specified abnormal immunological findings in serum: Secondary | ICD-10-CM | POA: Diagnosis not present

## 2023-01-01 DIAGNOSIS — M79641 Pain in right hand: Secondary | ICD-10-CM | POA: Diagnosis not present

## 2023-01-01 DIAGNOSIS — M79671 Pain in right foot: Secondary | ICD-10-CM

## 2023-01-01 DIAGNOSIS — I480 Paroxysmal atrial fibrillation: Secondary | ICD-10-CM

## 2023-01-01 DIAGNOSIS — M79642 Pain in left hand: Secondary | ICD-10-CM

## 2023-01-03 LAB — CBC WITH DIFFERENTIAL/PLATELET
Absolute Monocytes: 902 cells/uL (ref 200–950)
Basophils Absolute: 69 cells/uL (ref 0–200)
Basophils Relative: 0.7 %
Eosinophils Absolute: 284 cells/uL (ref 15–500)
Eosinophils Relative: 2.9 %
HCT: 43.4 % (ref 38.5–50.0)
Hemoglobin: 14.4 g/dL (ref 13.2–17.1)
Lymphs Abs: 1842 cells/uL (ref 850–3900)
MCH: 30 pg (ref 27.0–33.0)
MCHC: 33.2 g/dL (ref 32.0–36.0)
MCV: 90.4 fL (ref 80.0–100.0)
MPV: 9.2 fL (ref 7.5–12.5)
Monocytes Relative: 9.2 %
Neutro Abs: 6703 cells/uL (ref 1500–7800)
Neutrophils Relative %: 68.4 %
Platelets: 437 10*3/uL — ABNORMAL HIGH (ref 140–400)
RBC: 4.8 10*6/uL (ref 4.20–5.80)
RDW: 12.3 % (ref 11.0–15.0)
Total Lymphocyte: 18.8 %
WBC: 9.8 10*3/uL (ref 3.8–10.8)

## 2023-01-03 LAB — COMPLETE METABOLIC PANEL WITH GFR
AG Ratio: 1.6 (calc) (ref 1.0–2.5)
ALT: 13 U/L (ref 9–46)
AST: 17 U/L (ref 10–35)
Albumin: 4.5 g/dL (ref 3.6–5.1)
Alkaline phosphatase (APISO): 110 U/L (ref 35–144)
BUN: 13 mg/dL (ref 7–25)
CO2: 29 mmol/L (ref 20–32)
Calcium: 9.7 mg/dL (ref 8.6–10.3)
Chloride: 100 mmol/L (ref 98–110)
Creat: 0.89 mg/dL (ref 0.70–1.28)
Globulin: 2.9 g/dL (calc) (ref 1.9–3.7)
Glucose, Bld: 78 mg/dL (ref 65–99)
Potassium: 4.2 mmol/L (ref 3.5–5.3)
Sodium: 141 mmol/L (ref 135–146)
Total Bilirubin: 0.6 mg/dL (ref 0.2–1.2)
Total Protein: 7.4 g/dL (ref 6.1–8.1)
eGFR: 89 mL/min/{1.73_m2} (ref >=60)

## 2023-01-03 LAB — QUANTIFERON-TB GOLD PLUS
Mitogen-NIL: >10 IU/mL
NIL: 0.06 IU/mL
QuantiFERON-TB Gold Plus: NEGATIVE
TB1-NIL: 0.01 IU/mL
TB2-NIL: 0.02 IU/mL

## 2023-01-03 LAB — SEDIMENTATION RATE: Sed Rate: 55 mm/h — ABNORMAL HIGH (ref 0–20)

## 2023-01-03 LAB — HEPATITIS B SURFACE ANTIGEN: Hepatitis B Surface Ag: NONREACTIVE

## 2023-01-03 LAB — C-REACTIVE PROTEIN: CRP: 43.9 mg/L — ABNORMAL HIGH (ref <8.0)

## 2023-01-03 LAB — HEPATITIS C ANTIBODY: Hepatitis C Ab: NONREACTIVE

## 2023-01-03 LAB — CYCLIC CITRUL PEPTIDE ANTIBODY, IGG: Cyclic Citrullin Peptide Ab: <16 UNITS

## 2023-01-03 LAB — HEPATITIS B CORE ANTIBODY, IGM: Hep B C IgM: NONREACTIVE

## 2023-01-28 NOTE — Progress Notes (Unsigned)
Office Visit Note  Patient: John Dennis             Date of Birth: 01/30/1946           MRN: [id number]             PCP: Pablo Lawrence, NP Referring: Pablo Lawrence, NP Visit Date: 01/29/2023   Subjective:  No chief complaint on file.   History of Present Illness: John Dennis is a 77 y.o. male here for follow up ***   Previous HPI 01/01/23 John Dennis is a 77 y.o. male here for evaluation of joint and muscle pain in multiple areas with positive rheumatoid factor and elevated sedimentation rate.  The pain has really been worse since last year sometime during the summertime.  He experiences pain in both hands especially around the MCP and PIP joints occasionally feeling of locking up and difficulty fully closing his hands or gripping tightly.  He frequently notices worsening of symptoms after driving or when using hand tools for prolonged periods of time.  He does see some visible swelling coming and going his feet and gets some pain more around the toes. But feet are less problematic than his hands most recently. He was ill with COVID in September and feels symptoms have been more severe since that time as well.  He gets a partial benefit with Tylenol and tries topical lidocaine which is helpful when his hands are especially painful.  His history is also significant for COPD and some worsening dyspnea on exertion after COVID illness.  He is on supplemental oxygen by nasal cannula for his COPD but this was at baseline even prior to the recent COVID illness.  Also with CAD s/p stenting and Afib on plavix.   Labs reviewed ANA neg RF 37.2 ESR 31 CRP 16   Imaging reviewed Lung imaging with multiple pulmonary nodules up to 12 mm diameter and most recent PET imaging with negative for FDG uptake   No Rheumatology ROS completed.   PMFS History:  Patient Active Problem List   Diagnosis Date Noted   Presence of Watchman left atrial appendage closure device 12/17/2022    B12 deficiency 02/07/2022   Iron deficiency anemia 12/14/2021   S/P coronary artery stent placement 10/05/2021   Thrombocytosis 01/20/2021   Renal cyst 11/14/2020   H/O polycythemia 10/17/2020   HFrEF (heart failure with reduced ejection fraction) ([place]) 07/01/2020   Anxiety 02/26/2020   Psychophysiological insomnia 02/26/2020   Nicotine dependence, uncomplicated 01/60/1093   Nonobstructive atherosclerosis of coronary artery 06/30/2018   Gastroesophageal reflux disease without esophagitis 04/04/2017   Lesion of liver 04/04/2017   Normocytic anemia 23/55/7322   Chronic systolic CHF (congestive heart failure) ([place]) 04/03/2017   History of stroke 04/03/2017   Hypokalemia 04/03/2017   Peptic ulcer disease 04/03/2017   Chronic obstructive pulmonary disease ([place]) 02/20/2016   Lung nodule 02/20/2016   Paroxysmal atrial fibrillation ([place]) 06/06/2015   Non-ischemic cardiomyopathy ([place]) 04/14/2015   LBBB (left bundle branch block) 10/04/2014   Coronary artery disease involving native coronary artery of native heart without angina pectoris 08/31/2014   Essential hypertension 08/31/2014   Hyperlipidemia 08/31/2014   Back pain of thoracolumbar region 11/12/2013    Past Medical History:  Diagnosis Date   CHF (congestive heart failure) ([place])    MI (myocardial infarction) ([place])    Stroke ([place])     No family history on file. Past Surgical History:  Procedure Laterality Date   ABLATION  [place]   GALLBLADDER SURGERY     SKIN CANCER EXCISION     TOTAL SHOULDER ARTHROPLASTY     Social History   Social History Narrative   Not on file   Immunization History  Administered Date(s) Administered   Fluad Quad(high Dose 65+) 10/17/2020   Influenza Inj Mdck Quad Pf 10/02/2017   Influenza Split 09/11/2019   PFIZER(Purple Top)SARS-COV-2 Vaccination 01/30/2020, 02/20/2020, 10/11/2020, 04/21/2021   Pneumococcal Conjugate-13 11/25/2018   Pneumococcal Polysaccharide-23 12/29/2019   Respiratory  Syncytial Virus Vaccine,Recomb Aduvanted(Arexvy) 10/10/2022   Zoster Recombinat (Shingrix) 05/16/2020     Objective: Vital Signs: There were no vitals taken for this visit.   Physical Exam   Musculoskeletal Exam: ***  CDAI Exam: CDAI Score: -- Patient Global: --; Provider Global: -- Swollen: --; Tender: -- Joint Exam 01/29/2023   No joint exam has been documented for this visit   There is currently no information documented on the homunculus. Go to the Rheumatology activity and complete the homunculus joint exam.  Investigation: No additional findings.  Imaging: XR Foot 2 Views Right  Result Date: 01/01/2023 X-ray right foot 2 views Tibiotalar joint space alignment appears normal.  Midfoot joint spaces appear normal.  1st metatarsal head probable postsurgical changes along medial border. MTP and IP joint spaces appear normal.  There is probable periarticular osteopenia of the MTP joints.  No erosions seen. Impression Para-articular osteopenia could be consistent with chronic inflammatory arthritis of the MTPs but no erosive disease  XR Foot 2 Views Left  Result Date: 01/01/2023 X-ray left foot 2 views Tibiotalar joint space alignment appears normal.  There is a small plantar calcaneal enthesophyte.  Midfoot joint spaces appear normal.  MTP and IP joint spaces appear normal.  There is probable periarticular osteopenia of the MTP joints.  No erosions seen. Impression Para-articular osteopenia and cock-up toe positioning could be consistent with chronic inflammatory arthritis of the MTPs but no erosive disease changes no significant osteoarthritis  XR Hand 2 View Right  Result Date: 01/01/2023 X-ray right hand 2 views Radiocarpal and carpal joint spaces appear normal.  MCP joint spaces appear normal with cystic changes and third MCP head.  Some periosteal reaction or possible early enthesophytes at the proximal phalanges especially third digit but involving others.  There is mild  asymmetric joint space narrowing at DIP joints.  No erosions seen.  Bone mineralization appears normal. Impression Changes may be consistent with early osteoarthritis although some evidence of chronic enthesopathy changes which could be chronic inflammatory or use related  XR Hand 2 View Left  Result Date: 01/01/2023 X-ray left hand 2 views Radiocarpal carpal joint spaces appear normal.  First MCP and IP joint osteophyte processes.  MCP joint spaces appear normal with cystic changes and third MCP head.  Some periosteal reaction or possible early enthesophytes at the proximal phalanges especially third digit.  There are some early lateral osteophyte and some asymmetric joint space narrowing at DIP joints.  No erosions seen.  Bone mineralization appears normal. Impression Changes may be consistent with early osteoarthritis although some evidence of chronic enthesopathy changes which could be chronic inflammatory or use related   Recent Labs: Lab Results  Component Value Date   WBC 9.8 01/01/2023   HGB 14.4 01/01/2023   PLT 437 (H) 01/01/2023   NA 141 01/01/2023   K 4.2 01/01/2023   CL 100 01/01/2023   CO2 29 01/01/2023   GLUCOSE 78 01/01/2023   BUN 13 01/01/2023   CREATININE 0.89 01/01/2023  BILITOT 0.6 01/01/2023   ALKPHOS 87 09/22/2013   AST 17 01/01/2023   ALT 13 01/01/2023   PROT 7.4 01/01/2023   ALBUMIN 3.9 09/22/2013   CALCIUM 9.7 01/01/2023   GFRAA >90 09/22/2013   QFTBGOLDPLUS NEGATIVE 01/01/2023    Speciality Comments: No specialty comments available.  Procedures:  No procedures performed Allergies: Contrast media [iodinated contrast media], Elemental sulfur, Oxycodone, Asa [aspirin], and Codeine   Assessment / Plan:     Visit Diagnoses: No diagnosis found.  ***  Orders: No orders of the defined types were placed in this encounter.  No orders of the defined types were placed in this encounter.    Follow-Up Instructions: No follow-ups on file.   Collier Salina, MD  Note - This record has been created using [employer].  Chart creation errors have been sought, but may not always  have been located. Such creation errors do not reflect on  the standard of medical care.

## 2023-01-29 ENCOUNTER — Encounter: Payer: Self-pay | Admitting: Internal Medicine

## 2023-01-29 ENCOUNTER — Ambulatory Visit: Payer: Medicare Other | Attending: Internal Medicine | Admitting: Internal Medicine

## 2023-01-29 VITALS — BP 111/56 | HR 60 | Resp 18 | Ht 68.0 in | Wt 175.0 lb

## 2023-01-29 DIAGNOSIS — I447 Left bundle-branch block, unspecified: Secondary | ICD-10-CM | POA: Diagnosis not present

## 2023-01-29 DIAGNOSIS — M059 Rheumatoid arthritis with rheumatoid factor, unspecified: Secondary | ICD-10-CM | POA: Diagnosis not present

## 2023-01-29 DIAGNOSIS — I5020 Unspecified systolic (congestive) heart failure: Secondary | ICD-10-CM | POA: Diagnosis not present

## 2023-01-29 DIAGNOSIS — J449 Chronic obstructive pulmonary disease, unspecified: Secondary | ICD-10-CM

## 2023-01-29 DIAGNOSIS — I480 Paroxysmal atrial fibrillation: Secondary | ICD-10-CM

## 2023-01-29 DIAGNOSIS — R911 Solitary pulmonary nodule: Secondary | ICD-10-CM

## 2023-01-29 MED ORDER — PREDNISONE 5 MG PO TABS: ORAL_TABLET | ORAL | 0 refills | Status: AC

## 2023-01-29 MED ORDER — HYDROXYCHLOROQUINE SULFATE 200 MG PO TABS: 200.0000 mg | ORAL_TABLET | Freq: Every day | ORAL | 2 refills | Status: AC

## 2023-04-01 ENCOUNTER — Ambulatory Visit: Payer: Medicare Other | Admitting: Internal Medicine
